# Patient Record
Sex: Male | Born: 1980 | Race: Black or African American | Hispanic: No | Marital: Single | Smoking: Current every day smoker
Health system: Southern US, Community
[De-identification: ages and names within clinical notes are randomized; demographics above are authoritative.]

## PROBLEM LIST (undated history)

## (undated) DIAGNOSIS — I10 Essential (primary) hypertension: Secondary | ICD-10-CM

## (undated) HISTORY — PX: FOOT SURGERY: SHX648

---

## 2005-06-20 ENCOUNTER — Emergency Department (HOSPITAL_COMMUNITY): Admission: EM | Admit: 2005-06-20 | Discharge: 2005-06-20 | Payer: Self-pay | Admitting: Specialist

## 2006-06-20 ENCOUNTER — Emergency Department (HOSPITAL_COMMUNITY): Admission: EM | Admit: 2006-06-20 | Discharge: 2006-06-20 | Payer: Self-pay | Admitting: Emergency Medicine

## 2007-06-18 ENCOUNTER — Emergency Department (HOSPITAL_COMMUNITY): Admission: EM | Admit: 2007-06-18 | Discharge: 2007-06-19 | Payer: Self-pay | Admitting: Emergency Medicine

## 2014-08-13 ENCOUNTER — Encounter (HOSPITAL_COMMUNITY): Payer: Self-pay | Admitting: *Deleted

## 2014-08-13 ENCOUNTER — Emergency Department (HOSPITAL_COMMUNITY)
Admission: EM | Admit: 2014-08-13 | Discharge: 2014-08-13 | Disposition: A | Discharge status: Home / Self Care | Payer: Self-pay | Attending: Emergency Medicine | Admitting: Emergency Medicine

## 2014-08-13 ENCOUNTER — Emergency Department (HOSPITAL_COMMUNITY): Payer: Self-pay

## 2014-08-13 DIAGNOSIS — Y999 Unspecified external cause status: Secondary | ICD-10-CM | POA: Insufficient documentation

## 2014-08-13 DIAGNOSIS — S01111A Laceration without foreign body of right eyelid and periocular area, initial encounter: Secondary | ICD-10-CM | POA: Insufficient documentation

## 2014-08-13 DIAGNOSIS — Y929 Unspecified place or not applicable: Secondary | ICD-10-CM | POA: Insufficient documentation

## 2014-08-13 DIAGNOSIS — S0083XA Contusion of other part of head, initial encounter: Secondary | ICD-10-CM

## 2014-08-13 DIAGNOSIS — S0181XA Laceration without foreign body of other part of head, initial encounter: Secondary | ICD-10-CM

## 2014-08-13 DIAGNOSIS — I10 Essential (primary) hypertension: Secondary | ICD-10-CM | POA: Insufficient documentation

## 2014-08-13 DIAGNOSIS — Y939 Activity, unspecified: Secondary | ICD-10-CM | POA: Insufficient documentation

## 2014-08-13 DIAGNOSIS — S0990XA Unspecified injury of head, initial encounter: Secondary | ICD-10-CM | POA: Insufficient documentation

## 2014-08-13 DIAGNOSIS — Z720 Tobacco use: Secondary | ICD-10-CM | POA: Insufficient documentation

## 2014-08-13 DIAGNOSIS — Z23 Encounter for immunization: Secondary | ICD-10-CM | POA: Insufficient documentation

## 2014-08-13 HISTORY — DX: Essential (primary) hypertension: I10

## 2014-08-13 MED ORDER — TETANUS-DIPHTH-ACELL PERTUSSIS 5-2.5-18.5 LF-MCG/0.5 IM SUSP
0.5000 mL | Freq: Once | INTRAMUSCULAR | Status: AC
  Administered 2014-08-13: 0.5 mL via INTRAMUSCULAR
  Filled 2014-08-13: qty 0.5
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
## 2014-08-13 NOTE — ED Notes (Signed)
Patient c/o headache

## 2014-08-13 NOTE — Discharge Instructions (Signed)
Tissue Adhesive Wound Care Some cuts, wounds, lacerations, and incisions can be repaired by using tissue adhesive. Tissue adhesive is like glue. It holds the skin together, allowing for faster healing. It forms a strong bond on the skin in about 1 minute and reaches its full strength in about 2 or 3 minutes. The adhesive disappears naturally while the wound is healing. It is important to take proper care of your wound at home while it heals.  HOME CARE INSTRUCTIONS   Showers are allowed. Do not soak the area containing the tissue adhesive. Do not take baths, swim, or use hot tubs. Do not use any soaps or ointments on the wound. Certain ointments can weaken the glue.  If a bandage (dressing) has been applied, follow your health care provider's instructions for how often to change the dressing.   Keep the dressing dry if one has been applied.   Do not scratch, pick, or rub the adhesive.   Do not place tape over the adhesive. The adhesive could come off when pulling the tape off.   Protect the wound from further injury until it is healed.   Protect the wound from sun and tanning bed exposure while it is healing and for several weeks after healing.   Only take over-the-counter or prescription medicines as directed by your health care provider.   Keep all follow-up appointments as directed by your health care provider. SEEK IMMEDIATE MEDICAL CARE IF:   Your wound becomes red, swollen, hot, or tender.   You develop a rash after the glue is applied.  You have increasing pain in the wound.   You have a red streak that goes away from the wound.   You have pus coming from the wound.   You have increased bleeding.  You have a fever.  You have shaking chills.   You notice a bad smell coming from the wound.   Your wound or adhesive breaks open.  MAKE SURE YOU:   Understand these instructions.  Will watch your condition.  Will get help right away if you are not doing  well or get worse. Document Released: 07/07/2000 Document Revised: 11/01/2012 Document Reviewed: 08/02/2012 [facility] Patient Information 2015 [place], [place]. This information is not intended to replace advice given to you by your health care provider. Make sure you discuss any questions you have with your health care provider.  Contusion A contusion is a deep bruise. Contusions are the result of an injury that caused bleeding under the skin. The contusion may turn blue, purple, or yellow. Minor injuries will give you a painless contusion, but more severe contusions may stay painful and swollen for a few weeks.  CAUSES  A contusion is usually caused by a blow, trauma, or direct force to an area of the body. SYMPTOMS   Swelling and redness of the injured area.  Bruising of the injured area.  Tenderness and soreness of the injured area.  Pain. DIAGNOSIS  The diagnosis can be made by taking a history and physical exam. An X-ray, CT scan, or MRI may be needed to determine if there were any associated injuries, such as fractures. TREATMENT  Specific treatment will depend on what area of the body was injured. In general, the best treatment for a contusion is resting, icing, elevating, and applying cold compresses to the injured area. Over-the-counter medicines may also be recommended for pain control. Ask your caregiver what the best treatment is for your contusion. HOME CARE INSTRUCTIONS   Put ice  on the injured area.  Put ice in a plastic bag.  Place a towel between your skin and the bag.  Leave the ice on for 15-20 minutes, 3-4 times a day, or as directed by your health care provider.  Only take over-the-counter or prescription medicines for pain, discomfort, or fever as directed by your caregiver. Your caregiver may recommend avoiding anti-inflammatory medicines (aspirin, ibuprofen, and naproxen) for 48 hours because these medicines may increase bruising.  Rest the injured area.  If  possible, elevate the injured area to reduce swelling. SEEK IMMEDIATE MEDICAL CARE IF:   You have increased bruising or swelling.  You have pain that is getting worse.  Your swelling or pain is not relieved with medicines. MAKE SURE YOU:   Understand these instructions.  Will watch your condition.  Will get help right away if you are not doing well or get worse. Document Released: 10/21/2004 Document Revised: 01/16/2013 Document Reviewed: 11/16/2010 [facility] Patient Information 2015 [place]. This information is not intended to replace advice given to you by your health care provider. Make sure you discuss any questions you have with your health care provider.   Assault, General Assault includes any behavior, whether intentional or reckless, which results in bodily injury to another person and/or damage to property. Included in this would be any behavior, intentional or reckless, that by its nature would be understood (interpreted) by a reasonable person as intent to harm another person or to damage his/her property. Threats may be oral or written. They may be communicated through regular mail, computer, fax, or phone. These threats may be direct or implied. FORMS OF ASSAULT INCLUDE:  Physically assaulting a person. This includes physical threats to inflict physical harm as well as:  Slapping.  Hitting.  Poking.  Kicking.  Punching.  Pushing.  Arson.  Sabotage.  Equipment vandalism.  Damaging or destroying property.  Throwing or hitting objects.  Displaying a weapon or an object that appears to be a weapon in a threatening manner.  Carrying a firearm of any kind.  Using a weapon to harm someone.  Using greater physical size/strength to intimidate another.  Making intimidating or threatening gestures.  Bullying.  Hazing.  Intimidating, threatening, hostile, or abusive language directed toward another person.  It communicates the intention to engage  in violence against that person. And it leads a reasonable person to expect that violent behavior may occur.  Stalking another person. IF IT HAPPENS AGAIN:  Immediately call for emergency help (911 in [place]  If someone poses clear and immediate danger to you, seek legal authorities to have a protective or restraining order put in place.  Less threatening assaults can at least be reported to authorities. STEPS TO TAKE IF A SEXUAL ASSAULT HAS HAPPENED  Go to an area of safety. This may include a shelter or staying with a friend. Stay away from the area where you have been attacked. A large percentage of sexual assaults are caused by a friend, relative or associate.  If medications were given by your caregiver, take them as directed for the full length of time prescribed.  Only take over-the-counter or prescription medicines for pain, discomfort, or fever as directed by your caregiver.  If you have come in contact with a sexual disease, find out if you are to be tested again. If your caregiver is concerned about the HIV/AIDS virus, he/she may require you to have continued testing for several months.  For the protection of your privacy, test results can  not be given over the phone. Make sure you receive the results of your test. If your test results are not back during your visit, make an appointment with your caregiver to find out the results. Do not assume everything is normal if you have not heard from your caregiver or the medical facility. It is important for you to follow up on all of your test results.  File appropriate papers with authorities. This is important in all assaults, even if it has occurred in a family or by a friend. SEEK MEDICAL CARE IF:  You have new problems because of your injuries.  You have problems that may be because of the medicine you are taking, such as:  Rash.  Itching.  Swelling.  Trouble breathing.  You develop belly (abdominal) pain, feel sick to  your stomach (nausea) or are vomiting.  You begin to run a temperature.  You need supportive care or referral to a rape crisis center. These are centers with trained personnel who can help you get through this ordeal. SEEK IMMEDIATE MEDICAL CARE IF:  You are afraid of being threatened, beaten, or abused. In [place] call 911.  You receive new injuries related to abuse.  You develop severe pain in any area injured in the assault or have any change in your condition that concerns you.  You faint or lose consciousness.  You develop chest pain or shortness of breath. Document Released: 01/11/2005 Document Revised: 04/05/2011 Document Reviewed: 08/30/2007 [facility] Patient Information 2015 [place], [place]. This information is not intended to replace advice given to you by your health care provider. Make sure you discuss any questions you have with your health care provider.

## 2014-08-13 NOTE — ED Notes (Signed)
Pt ambulating independently w/ steady gait on d/c in no acute distress, A&Ox4.D/c instructions reviewed w/ pt and family - pt and family deny any further questions or concerns at present.  
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
## 2015-05-04 ENCOUNTER — Encounter (HOSPITAL_COMMUNITY): Payer: Self-pay | Admitting: Emergency Medicine

## 2015-05-04 ENCOUNTER — Emergency Department (HOSPITAL_COMMUNITY)
Admission: EM | Admit: 2015-05-04 | Discharge: 2015-05-04 | Disposition: A | Discharge status: Home / Self Care | Payer: Self-pay | Attending: Emergency Medicine | Admitting: Emergency Medicine

## 2015-05-04 DIAGNOSIS — K429 Umbilical hernia without obstruction or gangrene: Secondary | ICD-10-CM | POA: Insufficient documentation

## 2015-05-04 DIAGNOSIS — F17200 Nicotine dependence, unspecified, uncomplicated: Secondary | ICD-10-CM | POA: Insufficient documentation

## 2015-05-04 DIAGNOSIS — I10 Essential (primary) hypertension: Secondary | ICD-10-CM | POA: Insufficient documentation

## 2015-05-04 LAB — CBC WITH DIFFERENTIAL/PLATELET
BASOS ABS: 0 10*3/uL (ref 0.0–0.1)
BASOS PCT: 0 %
EOS PCT: 2 %
Eosinophils Absolute: 0.1 10*3/uL (ref 0.0–0.7)
HCT: 45.9 % (ref 39.0–52.0)
Hemoglobin: 16.1 g/dL (ref 13.0–17.0)
Lymphocytes Relative: 55 %
Lymphs Abs: 2 10*3/uL (ref 0.7–4.0)
MCH: 31.3 pg (ref 26.0–34.0)
MCHC: 35.1 g/dL (ref 30.0–36.0)
MCV: 89.3 fL (ref 78.0–100.0)
MONO ABS: 0.3 10*3/uL (ref 0.1–1.0)
Monocytes Relative: 8 %
Neutro Abs: 1.2 10*3/uL — ABNORMAL LOW (ref 1.7–7.7)
Neutrophils Relative %: 35 %
PLATELETS: 200 10*3/uL (ref 150–400)
RBC: 5.14 MIL/uL (ref 4.22–5.81)
RDW: 11.9 % (ref 11.5–15.5)
WBC: 3.6 10*3/uL — AB (ref 4.0–10.5)

## 2015-05-04 LAB — BASIC METABOLIC PANEL
ANION GAP: 8 (ref 5–15)
BUN: 15 mg/dL (ref 6–20)
CO2: 24 mmol/L (ref 22–32)
CREATININE: 1.04 mg/dL (ref 0.61–1.24)
Calcium: 9.3 mg/dL (ref 8.9–10.3)
Chloride: 111 mmol/L (ref 101–111)
GFR calc non Af Amer: >60 mL/min (ref >60)
Glucose, Bld: 103 mg/dL — ABNORMAL HIGH (ref 65–99)
Potassium: 4.1 mmol/L (ref 3.5–5.1)
SODIUM: 143 mmol/L (ref 135–145)

## 2015-05-04 MED ORDER — LISINOPRIL 10 MG PO TABS: 10.0000 mg | ORAL_TABLET | Freq: Every day | ORAL | Status: DC

## 2015-05-04 NOTE — ED Notes (Signed)
Pt reports incidental finding of umbilical hernia when getting physical exam for a new job. Pt states needs hernia cleared by MD so he can be hired.  NAD noted at this time.

## 2015-05-04 NOTE — ED Provider Notes (Signed)
CSN: [id number]     Arrival date & time 05/04/15  1013 History   First MD Initiated Contact with Patient 05/04/15 1020     Chief Complaint  Patient presents with  . Hernia     (Consider location/radiation/quality/duration/timing/severity/associated sxs/prior Treatment) The history is provided by the patient.  Jose Hensley is a 35 y.o. male hx of HTN not on medication here with umbilical hernia. Patient started a new job recently and needed medical exam. He had a medical exam done about 2 weeks ago and found that he was hypertensive to 160/110 and had a reducible umbilical hernia. They recommend exam by a doctor to clear him for work. He picks up heavy items at work and denies abdominal pain or vomiting. States that he had umbilical hernia that has been there for years that is always reducible. Denies chest pain or shortness of breath or fevers. Smokes 1 ppd and uses marijuana.    Past Medical History  Diagnosis Date  . Hypertension    Past Surgical History  Procedure Laterality Date  . Foot surgery     No family history on file. Social History  Substance Use Topics  . Smoking status: Current Every Day Smoker -- 1.00 packs/day  . Smokeless tobacco: Never Used  . Alcohol Use: Yes    Review of Systems  Gastrointestinal: Negative for abdominal pain.       Umbilical hernia   All other systems reviewed and are negative.     Allergies  Review of patient's allergies indicates no known allergies.  Home Medications   Prior to Admission medications   Not on File   BP 150/110 mmHg  Pulse 66  Temp(Src) 98.4 F (36.9 C) (Oral)  Resp 20  SpO2 100% Physical Exam  Constitutional: He is oriented to person, place, and time. He appears well-developed and well-nourished.  HENT:  Head: Normocephalic.  Mouth/Throat: Oropharynx is clear and moist.  Eyes: Conjunctivae are normal. Pupils are equal, round, and reactive to light.  Neck: Normal range of motion. Neck supple.   Cardiovascular: Normal rate, regular rhythm and normal heart sounds.   Pulmonary/Chest: Effort normal and breath sounds normal. No respiratory distress. He has no wheezes. He has no rales.  Abdominal: Soft. Bowel sounds are normal. He exhibits no distension. There is no tenderness. There is no rebound.  Umbilical hernia that is soft, small, easily reducible   Musculoskeletal: Normal range of motion. He exhibits no edema or tenderness.  Neurological: He is alert and oriented to person, place, and time. No cranial nerve deficit. Coordination normal.  Skin: Skin is warm and dry.  Psychiatric: He has a normal mood and affect. His behavior is normal. Judgment and thought content normal.  Nursing note and vitals reviewed.   ED Course  Procedures (including critical care time) Labs Review Labs Reviewed  CBC WITH DIFFERENTIAL/PLATELET - Abnormal; Notable for the following:    WBC 3.6 (*)    Neutro Abs 1.2 (*)    All other components within normal limits  BASIC METABOLIC PANEL - Abnormal; Notable for the following:    Glucose, Bld 103 (*)    All other components within normal limits    Imaging Review No results found. I have personally reviewed and evaluated these images and lab results as part of my medical decision-making.   EKG Interpretation None      MDM   Final diagnoses:  None   Jose Overalllvin Gagen is a 35 y.o. male here to check umbilical hernia for  employment. The hernia is reducible and not strangulated. Has no pain or vomiting. I think can get outpatient surgery eval as needed. He is persistently hypertensive however. He was in the ED a year ago and had BP 160/110 and also elevated 2 weeks ago. Denies chest pain or shortness of breath. Will get chemistry. Will likely start lisinopril for hypertension.   11:42 AM Cr nl. Will start lisinopril 10 mg daily. He can see PCP and surgery outpatient. Can go back to work.    Richardean Canal, MD 05/04/15 [phone]

## 2015-05-04 NOTE — ED Notes (Signed)
MD at bedside. 

## 2015-05-04 NOTE — Discharge Instructions (Signed)
Take lisinopril daily. See your primary care doctor in 2 weeks to recheck blood pressure.   You have a small umbilical hernia and can follow up with surgery outpatient but you can work.   Return to ER if you have severe abdominal pain, hernia not reducible, headaches, chest pain, vomiting.

## 2015-08-12 ENCOUNTER — Encounter (HOSPITAL_COMMUNITY): Payer: Self-pay | Admitting: Emergency Medicine

## 2015-08-12 ENCOUNTER — Emergency Department (HOSPITAL_COMMUNITY)
Admission: EM | Admit: 2015-08-12 | Discharge: 2015-08-13 | Disposition: A | Discharge status: Home / Self Care | Payer: Worker's Compensation | Attending: Emergency Medicine | Admitting: Emergency Medicine

## 2015-08-12 ENCOUNTER — Emergency Department (HOSPITAL_COMMUNITY): Payer: Worker's Compensation

## 2015-08-12 DIAGNOSIS — I10 Essential (primary) hypertension: Secondary | ICD-10-CM | POA: Insufficient documentation

## 2015-08-12 DIAGNOSIS — Z79899 Other long term (current) drug therapy: Secondary | ICD-10-CM | POA: Insufficient documentation

## 2015-08-12 DIAGNOSIS — S8781XA Crushing injury of right lower leg, initial encounter: Secondary | ICD-10-CM

## 2015-08-12 DIAGNOSIS — W231XXA Caught, crushed, jammed, or pinched between stationary objects, initial encounter: Secondary | ICD-10-CM | POA: Insufficient documentation

## 2015-08-12 DIAGNOSIS — S8991XA Unspecified injury of right lower leg, initial encounter: Secondary | ICD-10-CM | POA: Diagnosis present

## 2015-08-12 DIAGNOSIS — Y9289 Other specified places as the place of occurrence of the external cause: Secondary | ICD-10-CM | POA: Diagnosis not present

## 2015-08-12 DIAGNOSIS — S81811A Laceration without foreign body, right lower leg, initial encounter: Secondary | ICD-10-CM | POA: Insufficient documentation

## 2015-08-12 DIAGNOSIS — Y990 Civilian activity done for income or pay: Secondary | ICD-10-CM | POA: Diagnosis not present

## 2015-08-12 DIAGNOSIS — Y9389 Activity, other specified: Secondary | ICD-10-CM | POA: Insufficient documentation

## 2015-08-12 DIAGNOSIS — F17200 Nicotine dependence, unspecified, uncomplicated: Secondary | ICD-10-CM | POA: Insufficient documentation

## 2015-08-12 NOTE — ED Notes (Signed)
Pt transported to XRAY °

## 2015-08-12 NOTE — ED Provider Notes (Signed)
CSN: [id number]     Arrival date & time 08/12/15  2301 History   First MD Initiated Contact with Patient 08/12/15 2313     Chief Complaint  Patient presents with  . 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 He reports the machine he was riding jerked causing him to lose his control position and his right leg was caught between the machine and a steel rail. There is a wound from the machine to the medial right lower leg. No other injury.   The history is provided by the patient. No language interpreter was used.    Past Medical History  Diagnosis Date  . Hypertension    Past Surgical History  Procedure Laterality Date  . Foot surgery     Family History  Problem Relation Age of Onset  . Hypertension Mother   . Cancer Father    Social History  Substance Use Topics  . Smoking status: Current Every Day Smoker -- 1.00 packs/day  . Smokeless tobacco: Never Used  . Alcohol Use: Yes    Review of Systems  Constitutional: Negative for fever and chills.  Musculoskeletal:       See HPI  Skin: Positive for wound.  Neurological: Negative.  Negative for numbness.      Allergies  Review of patient's allergies indicates no known allergies.  Home Medications   Prior to Admission medications   Medication Sig Start Date End Date Taking? Authorizing Provider  lisinopril (PRINIVIL,ZESTRIL) 10 MG tablet Take 1 tablet (10 mg total) by mouth daily. 05/04/15   Charlynne Pander, MD   BP 167/117 mmHg  Pulse 73  Temp(Src) 99.3 F (37.4 C) (Oral)  Resp 18  Ht 6' (1.829 m)  Wt 74.844 kg  BMI 22.37 kg/m2  SpO2 99% Physical Exam  Constitutional: He is oriented to person, place, and time. He appears well-developed and well-nourished.  Neck: Normal range of motion.  Cardiovascular:  Intact distal pulses.   Pulmonary/Chest: Effort normal.  Musculoskeletal: Normal range of motion.  Right lower leg without bony deformity or significant swelling. No bony tenderness. Calf soft and non-tender, and ankle non-tender. Distal pulses are intact.   Neurological: He is alert and oriented to person, place, and time.  Skin: Skin is warm and dry.  Laceration to right medial lower leg just proximal to the ankle measuring approximately 3 cm and is gaping.   Psychiatric: He has a normal mood and affect.    ED Course  Procedures (including critical care time) Labs Review Labs Reviewed - No data to display  Imaging Review No results found. I have personally reviewed and evaluated these images and lab results as part of my medical decision-making.   EKG Interpretation None     Dg Tibia/fibula Right  08/12/2015  CLINICAL DATA:  Injured right lower leg at work. Puncture wound. Initial encounter. EXAM: RIGHT TIBIA AND FIBULA - 2 VIEW COMPARISON:  Right ankle radiography 06/20/2005 FINDINGS: Medial lower leg skin irregularity correlating with history. No opaque foreign body or fracture. IMPRESSION: Soft tissue injury without opaque foreign body or fracture. Electronically Signed   By: Marnee Spring M.D.   On: 08/12/2015 23:59   LACERATION REPAIR Performed by: Elpidio Anis A Authorized by: Elpidio Anis A Consent: Verbal consent obtained. Risks and benefits: risks, benefits and alternatives were discussed Consent given by: patient Patient  identity confirmed: provided demographic data Prepped and Draped in normal sterile fashion Wound explored  Laceration Location: right lower leg, medial  Laceration Length: 3 cm  No Foreign Bodies seen or palpated  Anesthesia: local infiltration  Local anesthetic: lidocaine 1% w/epinephrine  Anesthetic total: 3 ml  Irrigation method: syringe Amount of cleaning: standard  Skin closure: 3-0 prolene, 3-0 vicryl  Number of sutures:  6  Technique: 2 SQ with vicryl in repair of disrupted fascia                    3 vertical mattress cutaneously                    1 simple interrupted cutaneously  Patient tolerance: Patient tolerated the procedure well with no immediate complications.  MDM   Final diagnoses:  None    1. Crush injury right lower leg 2. Laceration right lower leg  Patient presents after accident at work causing crush injury to right leg and laceration. Negative imaging. Negative for findings concerning for compartment syndrome. Symptoms of compartment syndrome and importance of returning to the emergency department discussed with the patient. Stable for discharge.     Elpidio AnisShari Shantoya Geurts, PA-C 08/13/15 0203  Laurence Spatesachel Morgan Little, MD 08/13/15 [phone]) [phone]

## 2015-08-12 NOTE — ED Notes (Signed)
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 The forklift kept moving and pinned his leg between the forklift and a steel beam. Very minimal bleeding. Able to move toes and good pedal pulses. Pt hoping around. Swelling right below the injury. Puncture wound down to the bone.  150 mcg fentanyl

## 2015-08-13 MED ORDER — HYDROCODONE-ACETAMINOPHEN 5-325 MG PO TABS: 1.0000 | ORAL_TABLET | ORAL | Status: DC | PRN

## 2015-08-13 MED ORDER — LISINOPRIL 10 MG PO TABS: 10.0000 mg | ORAL_TABLET | Freq: Every day | ORAL | Status: DC

## 2015-08-13 MED ORDER — OXYCODONE-ACETAMINOPHEN 5-325 MG PO TABS
1.0000 | ORAL_TABLET | Freq: Once | ORAL | Status: AC
  Administered 2015-08-13: 1 via ORAL
  Filled 2015-08-13: qty 1

## 2015-08-13 MED ORDER — CEPHALEXIN 250 MG PO CAPS
500.0000 mg | ORAL_CAPSULE | Freq: Once | ORAL | Status: AC
  Administered 2015-08-13: 500 mg via ORAL
  Filled 2015-08-13: qty 2

## 2015-08-13 NOTE — Discharge Instructions (Signed)
Hypertension Hypertension, commonly called high blood pressure, is when the force of blood pumping through your arteries is too strong. Your arteries are the blood vessels that carry blood from your heart throughout your body. A blood pressure reading consists of a higher number over a lower number, such as 110/72. The higher number (systolic) is the pressure inside your arteries when your heart pumps. The lower number (diastolic) is the pressure inside your arteries when your heart relaxes. Ideally you want your blood pressure below 120/80. Hypertension forces your heart to work harder to pump blood. Your arteries may become narrow or stiff. Having untreated or uncontrolled hypertension can cause heart attack, stroke, kidney disease, and other problems. RISK FACTORS Some risk factors for high blood pressure are controllable. Others are not.  Risk factors you cannot control include:   Race. You may be at higher risk if you are African American.  Age. Risk increases with age.  Gender. Men are at higher risk than women before age 45 years. After age 65, women are at higher risk than men. Risk factors you can control include:  Not getting enough exercise or physical activity.  Being overweight.  Getting too much fat, sugar, calories, or salt in your diet.  Drinking too much alcohol. SIGNS AND SYMPTOMS Hypertension does not usually cause signs or symptoms. Extremely high blood pressure (hypertensive crisis) may cause headache, anxiety, shortness of breath, and nosebleed. DIAGNOSIS To check if you have hypertension, your health care provider will measure your blood pressure while you are seated, with your arm held at the level of your heart. It should be measured at least twice using the same arm. Certain conditions can cause a difference in blood pressure between your right and left arms. A blood pressure reading that is higher than normal on one occasion does not mean that you need treatment. If  it is not clear whether you have high blood pressure, you may be asked to return on a different day to have your blood pressure checked again. Or, you may be asked to monitor your blood pressure at home for 1 or more weeks. TREATMENT Treating high blood pressure includes making lifestyle changes and possibly taking medicine. Living a healthy lifestyle can help lower high blood pressure. You may need to change some of your habits. Lifestyle changes may include:  Following the DASH diet. This diet is high in fruits, vegetables, and whole grains. It is low in salt, red meat, and added sugars.  Keep your sodium intake below 2,300 mg per day.  Getting at least 30-45 minutes of aerobic exercise at least 4 times per week.  Losing weight if necessary.  Not smoking.  Limiting alcoholic beverages.  Learning ways to reduce stress. Your health care provider may prescribe medicine if lifestyle changes are not enough to get your blood pressure under control, and if one of the following is true:  You are 18-59 years of age and your systolic blood pressure is above 140.  You are 60 years of age or older, and your systolic blood pressure is above 150.  Your diastolic blood pressure is above 90.  You have diabetes, and your systolic blood pressure is over 140 or your diastolic blood pressure is over 90.  You have kidney disease and your blood pressure is above 140/90.  You have heart disease and your blood pressure is above 140/90. Your personal target blood pressure may vary depending on your medical conditions, your age, and other factors. HOME CARE INSTRUCTIONS    Have your blood pressure rechecked as directed by your health care provider.   Take medicines only as directed by your health care provider. Follow the directions carefully. Blood pressure medicines must be taken as prescribed. The medicine does not work as well when you skip doses. Skipping doses also puts you at risk for  problems.  Do not smoke.   Monitor your blood pressure at home as directed by your health care provider. SEEK MEDICAL CARE IF:   You think you are having a reaction to medicines taken.  You have recurrent headaches or feel dizzy.  You have swelling in your ankles.  You have trouble with your vision. SEEK IMMEDIATE MEDICAL CARE IF:  You develop a severe headache or confusion.  You have unusual weakness, numbness, or feel faint.  You have severe chest or abdominal pain.  You vomit repeatedly.  You have trouble breathing. MAKE SURE YOU:   Understand these instructions.  Will watch your condition.  Will get help right away if you are not doing well or get worse.   This information is not intended to replace advice given to you by your health care provider. Make sure you discuss any questions you have with your health care provider.   Document Released: 01/11/2005 Document Revised: 05/28/2014 Document Reviewed: 11/03/2012 Elsevier Interactive Patient Education 2016 [employer] Laceration Care, Adult A laceration is a cut that goes through all of the layers of the skin and into the tissue that is right under the skin. Some lacerations heal on their own. Others need to be closed with stitches (sutures), staples, skin adhesive strips, or skin glue. Proper laceration care minimizes the risk of infection and helps the laceration to heal better. HOW TO CARE FOR YOUR LACERATION If sutures or staples were used:  Keep the wound clean and dry.  If you were given a bandage (dressing), you should change it at least one time per day or as told by your health care provider. You should also change it if it becomes wet or dirty.  Keep the wound completely dry for the first 24 hours or as told by your health care provider. After that time, you may shower or bathe. However, make sure that the wound is not soaked in water until after the sutures or staples have been removed.  Clean the  wound one time each day or as told by your health care provider:  Wash the wound with soap and water.  Rinse the wound with water to remove all soap.  Pat the wound dry with a clean towel. Do not rub the wound.  After cleaning the wound, apply a thin layer of antibiotic ointmentas told by your health care provider. This will help to prevent infection and keep the dressing from sticking to the wound.  Have the sutures or staples removed as told by your health care provider. If skin adhesive strips were used:  Keep the wound clean and dry.  If you were given a bandage (dressing), you should change it at least one time per day or as told by your health care provider. You should also change it if it becomes dirty or wet.  Do not get the skin adhesive strips wet. You may shower or bathe, but be careful to keep the wound dry.  If the wound gets wet, pat it dry with a clean towel. Do not rub the wound.  Skin adhesive strips fall off on their own. You may trim the strips as the wound  heals. Do not remove skin adhesive strips that are still stuck to the wound. They will fall off in time. If skin glue was used:  Try to keep the wound dry, but you may briefly wet it in the shower or bath. Do not soak the wound in water, such as by swimming.  After you have showered or bathed, gently pat the wound dry with a clean towel. Do not rub the wound.  Do not do any activities that will make you sweat heavily until the skin glue has fallen off on its own.  Do not apply liquid, cream, or ointment medicine to the wound while the skin glue is in place. Using those may loosen the film before the wound has healed.  If you were given a bandage (dressing), you should change it at least one time per day or as told by your health care provider. You should also change it if it becomes dirty or wet.  If a dressing is placed over the wound, be careful not to apply tape directly over the skin glue. Doing that may  cause the glue to be pulled off before the wound has healed.  Do not pick at the glue. The skin glue usually remains in place for 5-10 days, then it falls off of the skin. General Instructions  Take over-the-counter and prescription medicines only as told by your health care provider.  If you were prescribed an antibiotic medicine or ointment, take or apply it as told by your doctor. Do not stop using it even if your condition improves.  To help prevent scarring, make sure to cover your wound with sunscreen whenever you are outside after stitches are removed, after adhesive strips are removed, or when glue remains in place and the wound is healed. Make sure to wear a sunscreen of at least 30 SPF.  Do not scratch or pick at the wound.  Keep all follow-up visits as told by your health care provider. This is important.  Check your wound every day for signs of infection. Watch for:  Redness, swelling, or pain.  Fluid, blood, or pus.  Raise (elevate) the injured area above the level of your heart while you are sitting or lying down, if possible. SEEK MEDICAL CARE IF:  You received a tetanus shot and you have swelling, severe pain, redness, or bleeding at the injection site.  You have a fever.  A wound that was closed breaks open.  You notice a bad smell coming from your wound or your dressing.  You notice something coming out of the wound, such as wood or glass.  Your pain is not controlled with medicine.  You have increased redness, swelling, or pain at the site of your wound.  You have fluid, blood, or pus coming from your wound.  You notice a change in the color of your skin near your wound.  You need to change the dressing frequently due to fluid, blood, or pus draining from the wound.  You develop a new rash.  You develop numbness around the wound. SEEK IMMEDIATE MEDICAL CARE IF:  You develop severe swelling around the wound.  Your pain suddenly increases and is  severe.  You develop painful lumps near the wound or on skin that is anywhere on your body.  You have a red streak going away from your wound.  The wound is on your hand or foot and you cannot properly move a finger or toe.  The wound is on your hand  or foot and you notice that your fingers or toes look pale or bluish.   This information is not intended to replace advice given to you by your health care provider. Make sure you discuss any questions you have with your health care provider.   Document Released: 01/11/2005 Document Revised: 05/28/2014 Document Reviewed: 01/07/2014 Elsevier Interactive Patient Education [employer]

## 2015-08-13 NOTE — ED Notes (Signed)
Pt left at this time with all belongings.  

## 2015-08-14 ENCOUNTER — Telehealth: Payer: Self-pay | Admitting: *Deleted

## 2016-09-13 ENCOUNTER — Ambulatory Visit (HOSPITAL_COMMUNITY)
Admission: EM | Admit: 2016-09-13 | Discharge: 2016-09-13 | Disposition: A | Discharge status: Home / Self Care | Payer: Self-pay | Attending: Family Medicine | Admitting: Family Medicine

## 2016-09-13 ENCOUNTER — Encounter (HOSPITAL_COMMUNITY): Payer: Self-pay | Admitting: Emergency Medicine

## 2016-09-13 DIAGNOSIS — T148XXA Other injury of unspecified body region, initial encounter: Secondary | ICD-10-CM

## 2016-09-13 DIAGNOSIS — S20222A Contusion of left back wall of thorax, initial encounter: Secondary | ICD-10-CM

## 2016-09-13 MED ORDER — KETOROLAC TROMETHAMINE 60 MG/2ML IM SOLN
INTRAMUSCULAR | Status: AC
  Filled 2016-09-13: qty 2

## 2016-09-13 MED ORDER — KETOROLAC TROMETHAMINE 60 MG/2ML IM SOLN
60.0000 mg | Freq: Once | INTRAMUSCULAR | Status: AC
  Administered 2016-09-13: 60 mg via INTRAMUSCULAR

## 2016-09-13 MED ORDER — NAPROXEN 500 MG PO TABS: 500.0000 mg | ORAL_TABLET | Freq: Two times a day (BID) | ORAL | 0 refills | Status: AC

## 2016-09-13 MED ORDER — CYCLOBENZAPRINE HCL 10 MG PO TABS
10.0000 mg | ORAL_TABLET | Freq: Every day | ORAL | 0 refills | Status: DC
Start: 2016-09-13 — End: 2020-10-24

## 2016-09-13 NOTE — Discharge Instructions (Signed)
You were given a Toradol injection in office. Take naproxen as directed. Flexeril as needed for muscle spasm. Heat compresses as needed. This can take up to 2-3 weeks to completely resolve, but you should be feeling better each week. If experiencing worsening of symptoms, numbness, tingling of the inner thighs, loss of bladder or bowel control, to go to the emergency department for further evaluation.  Follow-up with primary care for further evaluation and treatment of high blood pressure. Monitor for any symptoms such as headache, chest pain, shortness of breath, weakness, dizziness, follow-up for reevaluation.

## 2016-09-13 NOTE — ED Provider Notes (Signed)
[facility]    CSN: [id number] Arrival date & time: 09/13/16  1649     History   Chief Complaint Chief Complaint  Patient presents with  . Back Pain    HPI Jose Hensley is a 36 y.o. male.   36 year old male comes in for left lower back pain after slipping and falling 1 day ago. He denies head injury, loss of consciousness. Denies chest pain, shortness of breath, trouble breathing. Denies blood in stool, blood in urine. Has noticed a contusion on left lower back with some abrasion. He has taken ibuprofen 800 mg with little relief. Denies numbness and tingling, loss of bladder or bowel control.      Past Medical History:  Diagnosis Date  . Hypertension     There are no active problems to display for this patient.   Past Surgical History:  Procedure Laterality Date  . FOOT SURGERY         Home Medications    Prior to Admission medications   Medication Sig Start Date End Date Taking? Authorizing Provider  cyclobenzaprine (FLEXERIL) 10 MG tablet Take 1 tablet (10 mg total) by mouth at bedtime. 09/13/16   Belinda Fisher, PA-C  HYDROcodone-acetaminophen (NORCO/VICODIN) 5-325 MG tablet Take 1-2 tablets by mouth every 4 (four) hours as needed. 08/13/15   Elpidio Anis, PA-C  lisinopril (PRINIVIL,ZESTRIL) 10 MG tablet Take 1 tablet (10 mg total) by mouth daily. 08/13/15   Elpidio Anis, PA-C  naproxen (NAPROSYN) 500 MG tablet Take 1 tablet (500 mg total) by mouth 2 (two) times daily. 09/13/16 09/23/16  Belinda Fisher, PA-C    Family History Family History  Problem Relation Age of Onset  . Hypertension Mother   . Cancer Father     Social History Social History  Substance Use Topics  . Smoking status: Current Every Day Smoker    Packs/day: 1.00  . Smokeless tobacco: Never Used  . Alcohol use Yes     Allergies   Patient has no known allergies.   Review of Systems Review of Systems  Reason unable to perform ROS: See HPI as above.     Physical Exam Triage  Vital Signs ED Triage Vitals  Enc Vitals Group     BP 09/13/16 1706 (!) 175/123     Pulse Rate 09/13/16 1706 71     Resp --      Temp 09/13/16 1706 98.2 F (36.8 C)     Temp Source 09/13/16 1706 Oral     SpO2 09/13/16 1706 100 %     Weight --      Height --      Head Circumference --      Peak Flow --      Pain Score 09/13/16 1705 9     Pain Loc --      Pain Edu? --      Excl. in GC? --    No data found.   Updated Vital Signs BP (!) 174/124 (BP Location: Right Arm)   Pulse 74   Temp 98.2 F (36.8 C) (Oral)   SpO2 100%   Physical Exam  Constitutional: He is oriented to person, place, and time. He appears well-developed and well-nourished. No distress.  HENT:  Head: Normocephalic and atraumatic.  Cardiovascular: Normal rate, regular rhythm and normal heart sounds.  Exam reveals no gallop and no friction rub.   No murmur heard. Pulmonary/Chest: Effort normal and breath sounds normal. No respiratory distress. He has no wheezes. He has  no rales.  Musculoskeletal:  Contusion and abrasion seen on left lower back. Tenderness on palpation of the left lower back as well as paraspinal region. No tenderness on palpation of the spinous processes. No tenderness on palpation of bilateral hips. Full range of motion of back and hips. Strength normal and equal bilaterally. Sensation intact and equal bilaterally. Negative straight leg raise.  Neurological: He is alert and oriented to person, place, and time.     UC Treatments / Results  Labs (all labs ordered are listed, but only abnormal results are displayed) Labs Reviewed - No data to display  EKG  EKG Interpretation None       Radiology No results found.  Procedures Procedures (including critical care time)  Medications Ordered in UC Medications  ketorolac (TORADOL) injection 60 mg (60 mg Intramuscular Given 09/13/16 1803)     Initial Impression / Assessment and Plan / UC Course  I have reviewed the triage vital  signs and the nursing notes.  Pertinent labs & imaging results that were available during my care of the patient were reviewed by me and considered in my medical decision making (see chart for details).     Discussed with patient history and exam most consistent with muscle strain. Start NSAID as directed for pain and inflammation. Muscle relaxant as needed. Ice/heat compresses. Discussed with patient strain can take up to 2-3 weeks to resolve, but should be getting better each week. Return precautions given.  Discussed vitals with patient, he denied headache, chest pain, shortness of breath, dizziness, weakness. Follow up with PCP for further management of hypertension. Return precautions given.  Final Clinical Impressions(s) / UC Diagnoses   Final diagnoses:  Muscle strain  Contusion of left side of back, initial encounter    New Prescriptions Discharge Medication List as of 09/13/2016  5:30 PM    START taking these medications   Details  cyclobenzaprine (FLEXERIL) 10 MG tablet Take 1 tablet (10 mg total) by mouth at bedtime., Starting Mon 09/13/2016, Normal    naproxen (NAPROSYN) 500 MG tablet Take 1 tablet (500 mg total) by mouth 2 (two) times daily., Starting Mon 09/13/2016, Until Thu 09/23/2016, Normal          Belinda Fisher, [place] 09/13/16 1838

## 2016-09-13 NOTE — ED Triage Notes (Signed)
Pt slipped on wet stairs on Sunday and hit his left back.  Pt has been having pain since then.

## 2018-04-23 IMAGING — CR DG TIBIA/FIBULA 2V*R*
4 series · 4 of 4 positions shown · non-contrast
Comparison: Right ankle radiography 06/20/2005

CLINICAL DATA: Injured right lower leg at work. Puncture wound.
Initial encounter.

EXAM:
RIGHT TIBIA AND FIBULA - 2 VIEW

[tibia ap (1 of 2)]
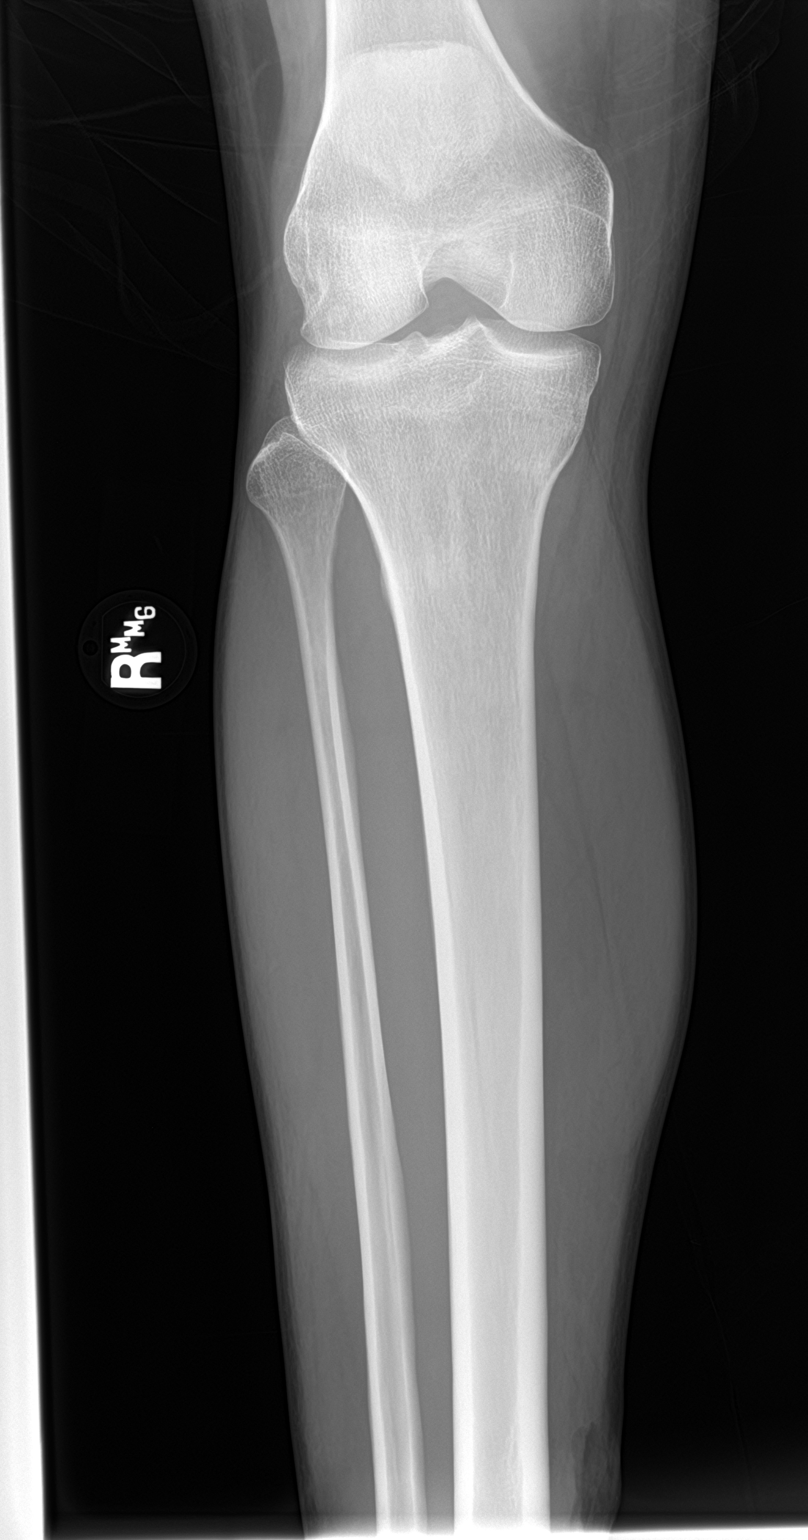

[tibia ap (2 of 2)]
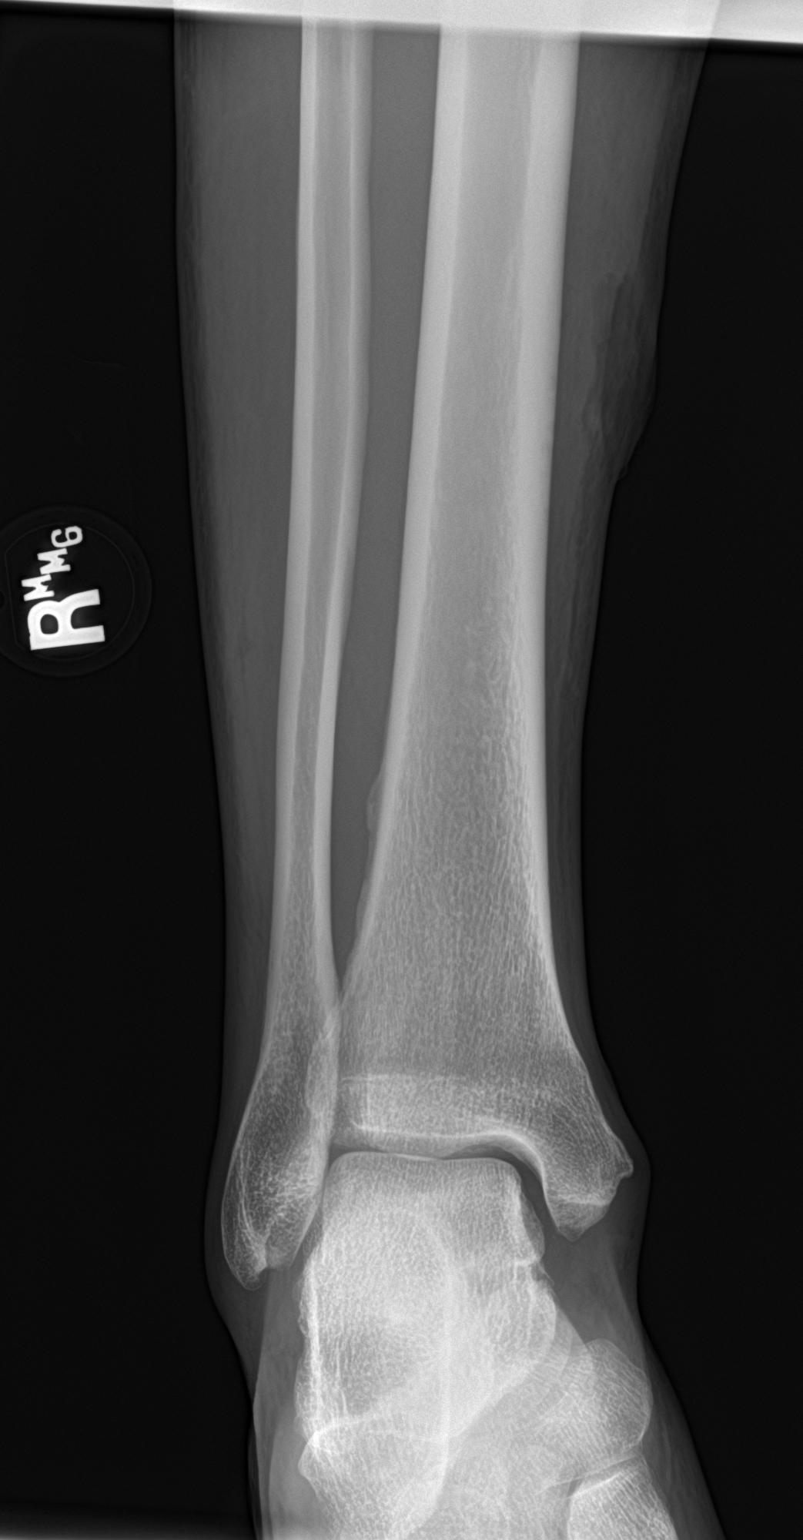

[tibia lat (1 of 2)]
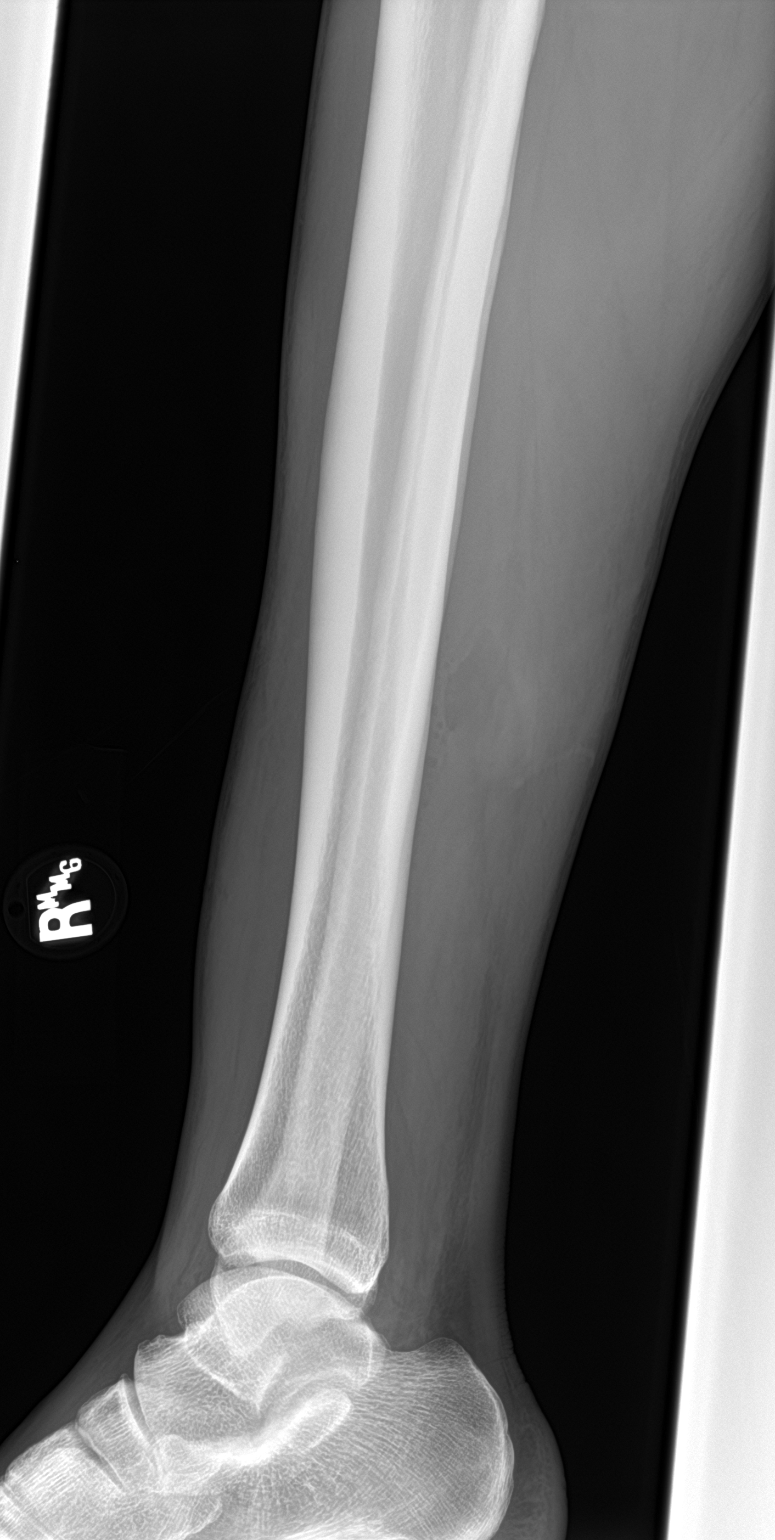

[tibia lat (2 of 2)]
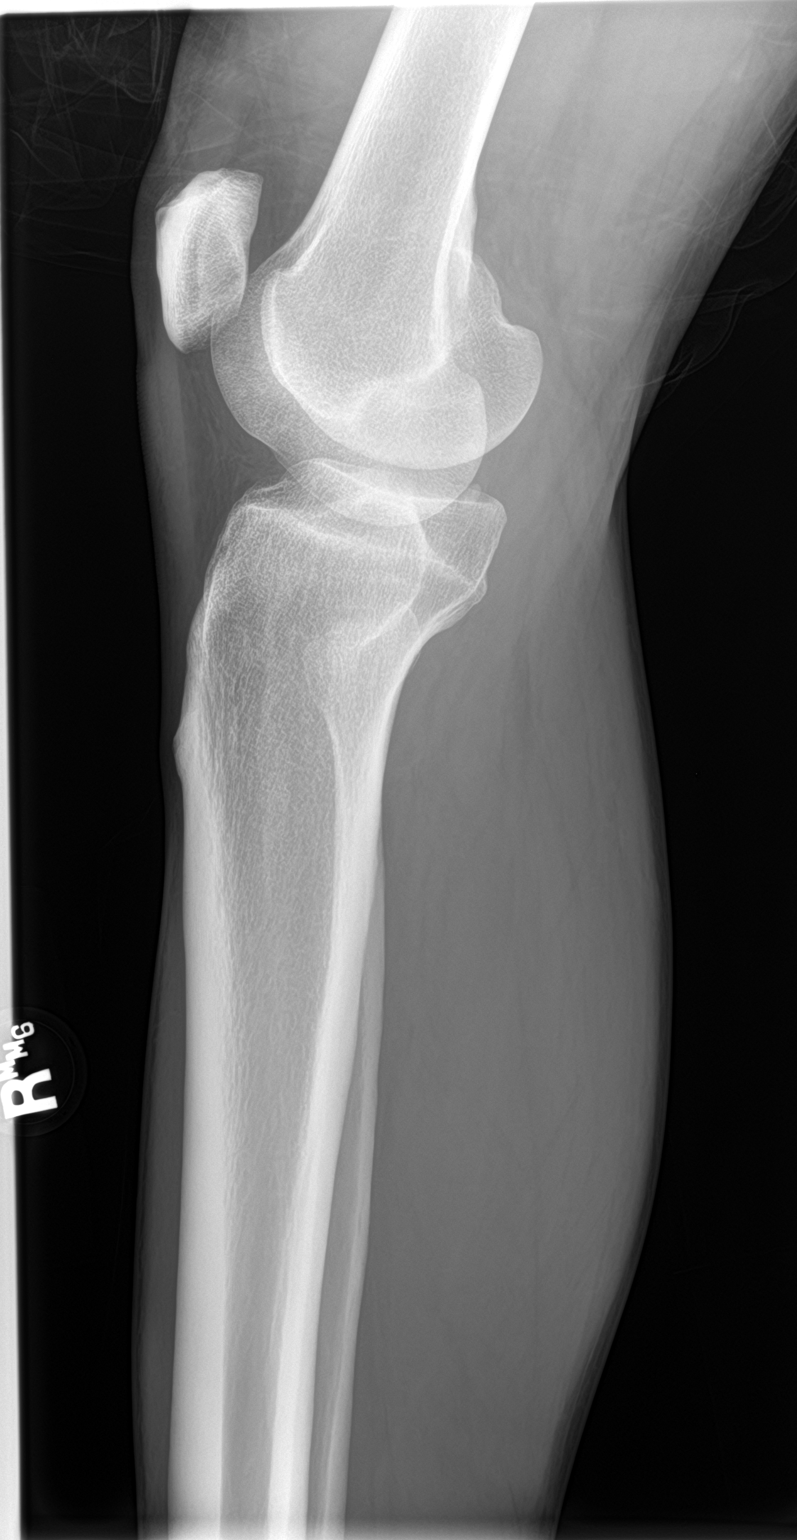

[4 of 4 positions shown; findings below may reference images not displayed]

FINDINGS: Medial lower leg skin irregularity correlating with history. No
opaque foreign body or fracture.
IMPRESSION: Soft tissue injury without opaque foreign body or fracture.

## 2020-10-24 ENCOUNTER — Encounter (HOSPITAL_COMMUNITY): Payer: Self-pay

## 2020-10-24 ENCOUNTER — Emergency Department (HOSPITAL_COMMUNITY)
Admission: EM | Admit: 2020-10-24 | Discharge: 2020-10-24 | Disposition: A | Discharge status: Home / Self Care | Payer: Self-pay | Attending: Emergency Medicine | Admitting: Emergency Medicine

## 2020-10-24 DIAGNOSIS — Y92410 Unspecified street and highway as the place of occurrence of the external cause: Secondary | ICD-10-CM | POA: Insufficient documentation

## 2020-10-24 DIAGNOSIS — I10 Essential (primary) hypertension: Secondary | ICD-10-CM | POA: Insufficient documentation

## 2020-10-24 DIAGNOSIS — F17210 Nicotine dependence, cigarettes, uncomplicated: Secondary | ICD-10-CM | POA: Insufficient documentation

## 2020-10-24 DIAGNOSIS — M545 Low back pain, unspecified: Secondary | ICD-10-CM | POA: Insufficient documentation

## 2020-10-24 DIAGNOSIS — Z79899 Other long term (current) drug therapy: Secondary | ICD-10-CM | POA: Insufficient documentation

## 2020-10-24 MED ORDER — METHOCARBAMOL 500 MG PO TABS: 500.0000 mg | ORAL_TABLET | Freq: Two times a day (BID) | ORAL | 0 refills | Status: AC

## 2020-10-24 MED ORDER — LISINOPRIL 10 MG PO TABS: 10.0000 mg | ORAL_TABLET | Freq: Every day | ORAL | 0 refills | Status: AC
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
## 2021-02-27 ENCOUNTER — Encounter (HOSPITAL_COMMUNITY): Payer: Self-pay | Admitting: Emergency Medicine

## 2021-02-27 ENCOUNTER — Other Ambulatory Visit: Payer: Self-pay

## 2021-02-27 ENCOUNTER — Ambulatory Visit (HOSPITAL_COMMUNITY)
Admission: EM | Admit: 2021-02-27 | Discharge: 2021-02-27 | Disposition: A | Discharge status: Home / Self Care | Payer: Self-pay | Attending: Emergency Medicine | Admitting: Emergency Medicine

## 2021-02-27 DIAGNOSIS — M79672 Pain in left foot: Secondary | ICD-10-CM

## 2021-02-27 DIAGNOSIS — M722 Plantar fascial fibromatosis: Secondary | ICD-10-CM

## 2021-02-27 MED ORDER — METHYLPREDNISOLONE SODIUM SUCC 125 MG IJ SOLR
INTRAMUSCULAR | Status: AC
  Filled 2021-02-27: qty 2

## 2021-02-27 MED ORDER — METHYLPREDNISOLONE SODIUM SUCC 125 MG IJ SOLR
125.0000 mg | Freq: Once | INTRAMUSCULAR | Status: AC
  Administered 2021-02-27: 125 mg via INTRAMUSCULAR

## 2021-02-27 MED ORDER — METHYLPREDNISOLONE 4 MG PO TABS: ORAL_TABLET | ORAL | 0 refills | Status: AC

## 2021-02-27 NOTE — ED Triage Notes (Signed)
Pt reports with c/o left foot pain since Monday that has gotten progressively worse. Pt denies any obvious injuries.
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
## 2021-02-27 NOTE — Discharge Instructions (Addendum)
Please read the enclosed information about Planter fasciitis and rehabilitation from Planter fasciitis.  You are provided with an injection of methylprednisolone in the office today and I recommend that you continue treatment of the inflammation in your left ankle and foot with a tapering oral steroid dose.  Please take them exactly as prescribed.  Please follow-up in the next 7 to 10 days if you have not had meaningful relief of the pain in your left foot.  Thank you for visiting urgent care today.
